# Patient Record
Sex: Male | Born: 1940 | Marital: Single | Smoking: Former smoker
Health system: Southern US, Community
[De-identification: ages and names within clinical notes are randomized; demographics above are authoritative.]

## PROBLEM LIST (undated history)

## (undated) DIAGNOSIS — I10 Essential (primary) hypertension: Secondary | ICD-10-CM

## (undated) DIAGNOSIS — I6529 Occlusion and stenosis of unspecified carotid artery: Secondary | ICD-10-CM

## (undated) DIAGNOSIS — M1990 Unspecified osteoarthritis, unspecified site: Secondary | ICD-10-CM

## (undated) DIAGNOSIS — I255 Ischemic cardiomyopathy: Secondary | ICD-10-CM

## (undated) DIAGNOSIS — E785 Hyperlipidemia, unspecified: Secondary | ICD-10-CM

## (undated) DIAGNOSIS — I509 Heart failure, unspecified: Secondary | ICD-10-CM

## (undated) DIAGNOSIS — K219 Gastro-esophageal reflux disease without esophagitis: Secondary | ICD-10-CM

## (undated) DIAGNOSIS — Z95810 Presence of automatic (implantable) cardiac defibrillator: Secondary | ICD-10-CM

## (undated) HISTORY — DX: Ischemic cardiomyopathy: I25.5

## (undated) HISTORY — PX: TONSILLECTOMY: SUR1361

## (undated) HISTORY — PX: CORONARY ANGIOPLASTY: SHX604

## (undated) HISTORY — DX: Gastro-esophageal reflux disease without esophagitis: K21.9

## (undated) HISTORY — DX: Heart failure, unspecified: I50.9

## (undated) HISTORY — DX: Occlusion and stenosis of unspecified carotid artery: I65.29

## (undated) HISTORY — DX: Essential (primary) hypertension: I10

## (undated) HISTORY — PX: CARDIAC CATHETERIZATION: SHX172

## (undated) HISTORY — PX: CAROTID ENDARTERECTOMY: SUR193

## (undated) HISTORY — DX: Hyperlipidemia, unspecified: E78.5

## (undated) HISTORY — PX: SP PTA PERIPHERAL: HXRAD401

## (undated) HISTORY — DX: Unspecified osteoarthritis, unspecified site: M19.90

## (undated) HISTORY — DX: Presence of automatic (implantable) cardiac defibrillator: Z95.810

## (undated) HISTORY — PX: HIP FRACTURE SURGERY: SHX118

---

## 2010-04-28 ENCOUNTER — Inpatient Hospital Stay: Payer: Self-pay | Admitting: Internal Medicine

## 2011-04-03 HISTORY — PX: CARDIAC DEFIBRILLATOR PLACEMENT: SHX171

## 2011-06-18 ENCOUNTER — Encounter: Payer: Self-pay | Admitting: Internal Medicine

## 2011-06-18 ENCOUNTER — Ambulatory Visit (INDEPENDENT_AMBULATORY_CARE_PROVIDER_SITE_OTHER): Payer: Medicare Other | Admitting: Internal Medicine

## 2011-06-18 ENCOUNTER — Emergency Department: Payer: Self-pay | Admitting: Emergency Medicine

## 2011-06-18 VITALS — BP 120/70 | HR 85 | Ht 70.0 in | Wt 164.0 lb

## 2011-06-18 DIAGNOSIS — I2589 Other forms of chronic ischemic heart disease: Secondary | ICD-10-CM

## 2011-06-18 DIAGNOSIS — I255 Ischemic cardiomyopathy: Secondary | ICD-10-CM | POA: Insufficient documentation

## 2011-06-18 DIAGNOSIS — Z95810 Presence of automatic (implantable) cardiac defibrillator: Secondary | ICD-10-CM

## 2011-06-18 LAB — ICD DEVICE OBSERVATION
AL THRESHOLD: 0.5 V
ATRIAL PACING ICD: 0 pct
BAMS-0001: 180 {beats}/min
BAMS-0003: 70 {beats}/min
DEVICE MODEL ICD: 641589
FVT: 0
RV LEAD AMPLITUDE: 12 mv
RV LEAD THRESHOLD: 1 V
TOT-0008: 0
TZAT-0001FASTVT: 1
TZAT-0004FASTVT: 8
TZAT-0012FASTVT: 200 ms
TZON-0005SLOWVT: 6
TZON-0010SLOWVT: 40 ms
TZST-0001FASTVT: 3
TZST-0001FASTVT: 5
TZST-0003FASTVT: 36 J
TZST-0003FASTVT: 40 J
VENTRICULAR PACING ICD: 21 pct

## 2011-06-18 NOTE — Assessment & Plan Note (Signed)
[employer]ry and pacing nurses worked for hours trying to get his device reset. It is been reset. We have given the patient a copy of the program.

## 2011-06-18 NOTE — Assessment & Plan Note (Signed)
Stable on current meds 

## 2011-06-18 NOTE — Progress Notes (Signed)
   History and Physical  Patient ID: Roger Mcneil MRN: [id number], SOB: Jul 24, 1940 71 y.o. Date of Encounter: 06/18/2011, 5:28 PM  Primary Physician: No primary provider on file. Primary Cardiologist:  Primary Electrophysiologist:    Chief Complaint: icd beeping  History of Present Illness: Roger Mcneil is a 71 y.o. male referred from the emergency room because his [employer] defibrillator implanted 3 months ago for primary prevention in a state of ischemic heart disease with an ejection fraction of 25% was beating and was not communicating with his remote station. [employer] has been by. There appeared a bit a software reset the device has been reprogrammed. There've been no intercurrent episodes. He said any chest pain or shortness of breath.   Past Medical History  Diagnosis Date  . Hypertension   . Hyperlipidemia   . Coronary artery disease   . MI (myocardial infarction) 2011  . Carotid artery occlusion   . CHF (congestive heart failure)   . Arthritis   . GERD (gastroesophageal reflux disease)   . Gout   . Ischemic cardiomyopathy     EF 25% s/p dual chamber implantable cardioverter/defibrillator implant.     Past Surgical History  Procedure Date  . Cardiac defibrillator placement 04/2011    Implanted by Dr. Juanita Craver in [place]   . Cardiac catheterization     stents x 6  . Sp pta peripheral   . Carotid endarterectomy 1900's  . Coronary angioplasty   . Tonsillectomy   . Hip fracture surgery     bilateral      Current Outpatient Prescriptions  Medication Sig Dispense Refill  . allopurinol (ZYLOPRIM) 300 MG tablet Take 300 mg by mouth daily.      Marland Kitchen aspirin 325 MG tablet Take 325 mg by mouth daily.      . carvedilol (COREG) 6.25 MG tablet Take 6.25 mg by mouth 2 (two) times daily with a meal.      . furosemide (LASIX) 20 MG tablet Take 20 mg by mouth 2 (two) times daily.      Marland Kitchen lisinopril (PRINIVIL,ZESTRIL) 10 MG tablet Take 10 mg by mouth daily.      Marland Kitchen loratadine  (CLARITIN) 10 MG tablet Take 10 mg by mouth daily.      . pantoprazole (PROTONIX) 40 MG tablet Take 40 mg by mouth daily.      . simvastatin (ZOCOR) 40 MG tablet Take 40 mg by mouth every evening.         Allergies: No Known Allergies   History  Substance Use Topics  . Smoking status: Former Smoker -- 1.0 packs/day for 45 years    Types: Cigarettes    Quit date: 06/17/2000  . Smokeless tobacco: Not on file  . Alcohol Use: 0.5 oz/week    1 drink(s) per week      History reviewed. No pertinent family history.    ROS:  Please see the history of present illness.   All other systems reviewed and negative.   Vital Signs: Blood pressure 120/70, pulse 85, height 5\' 10"  (1.778 m), weight 164 lb (74.39 kg).  PHYSICAL EXAM: Well developed and nourished in no acute distress HENT normal Neck supple with JVP-flat Clear Regular rate and rhythm, no murmurs or gallops Abd-soft with active BS No Clubbing cyanosis edema Skin-warm and dry A & Oriented  Grossly normal sensory and motor function   Labs:      ASSESSMENT AND PLAN:

## 2011-08-06 ENCOUNTER — Ambulatory Visit: Payer: Self-pay | Admitting: Gastroenterology

## 2012-03-02 IMAGING — CR DG CHEST 1V PORT
1 series · 1 of 1 positions shown · non-contrast
Comparison: none

REASON FOR EXAM: Chest Pain
COMMENTS:

[view not recorded]
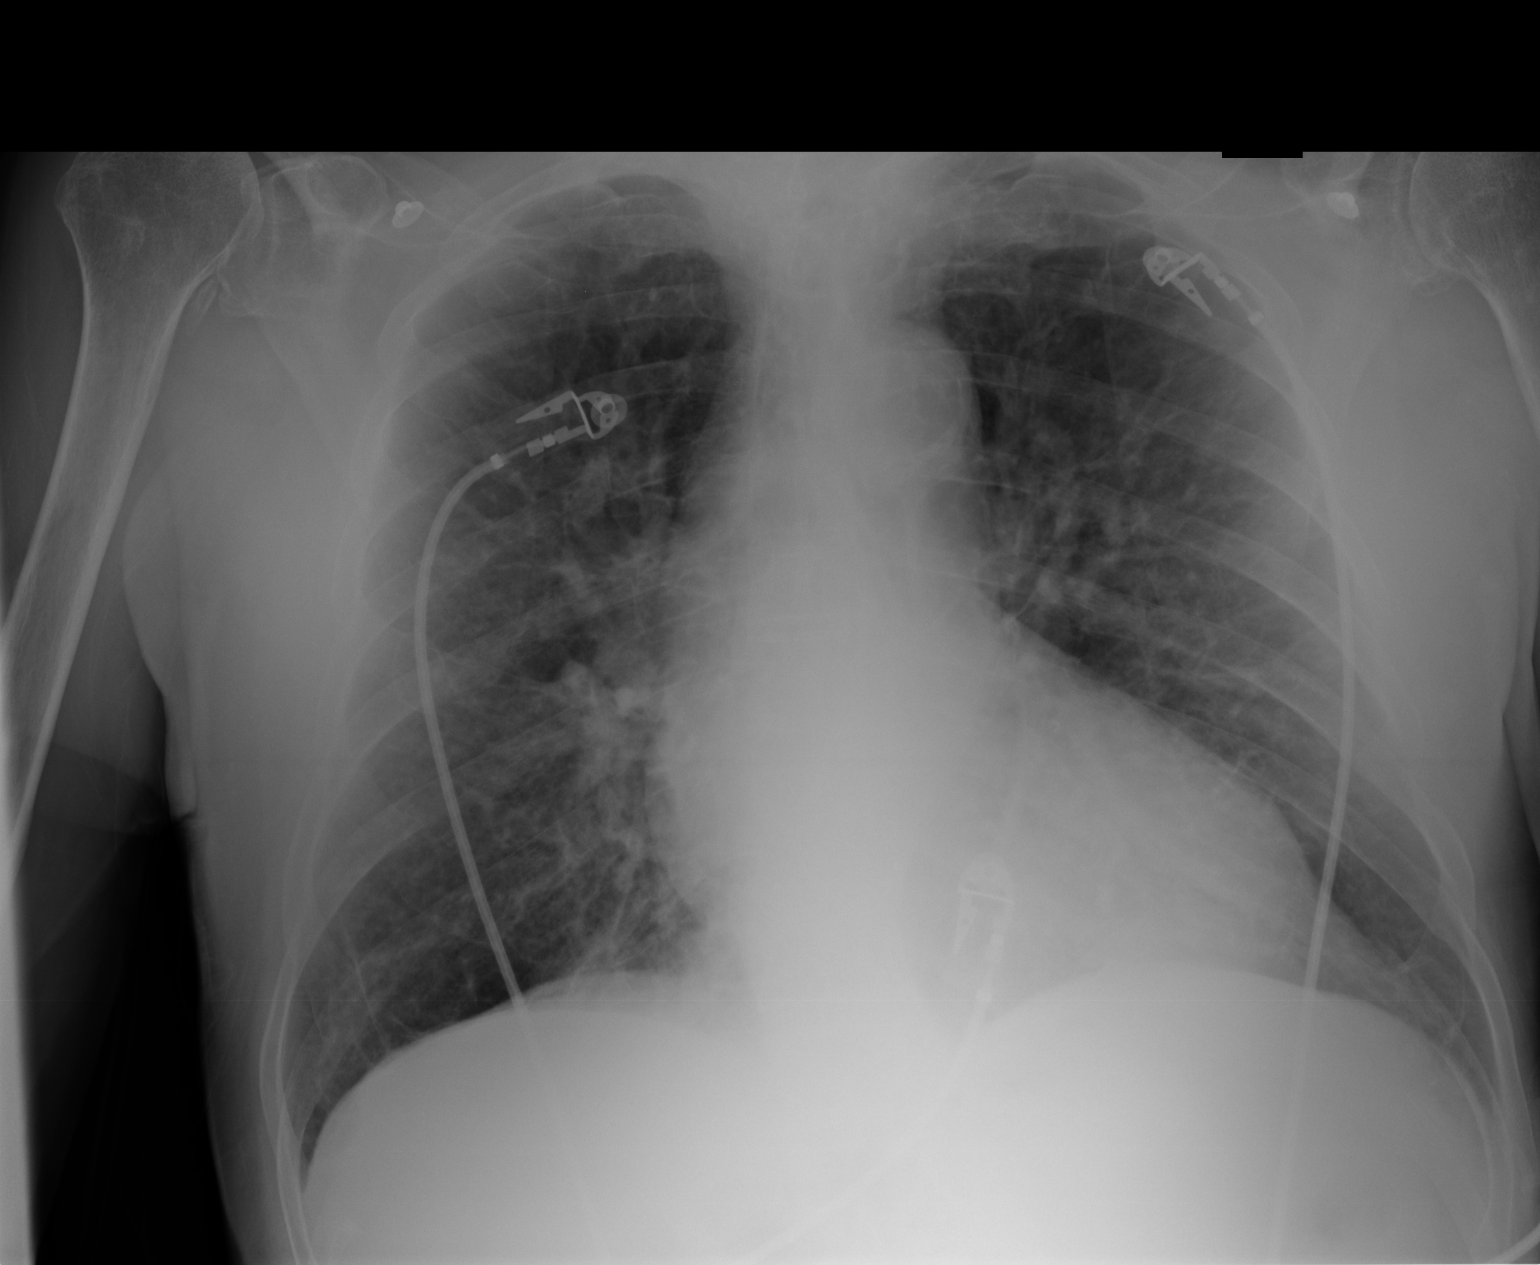

[1 of 1 positions shown; findings below may reference images not displayed]

PROCEDURE:     DXR - DXR PORTABLE CHEST SINGLE VIEW  - April 28, 2010  [DATE]

RESULT:     There is prominence of the pulmonary vascularity compatible with
pulmonary vascular congestion. No frank pulmonary edema or pleural effusion
is seen. The heart is mildly enlarged. No pneumonia is seen. Monitoring
electrodes are present.
IMPRESSION: 1.  There is mild prominence of the pulmonary vascularity compatible with
pulmonary vascular congestion.
2.  No pulmonary edema or pleural effusion is seen.
3.  Mild cardiomegaly.
4.  No pneumonia is identified.

## 2012-03-03 IMAGING — CR DG CHEST 2V
1 series · 2 of 2 positions shown · non-contrast
Comparison: none

REASON FOR EXAM: shortness of breath
COMMENTS:

[Series 1: view not recorded · 0.17mm/px · 2 of 2 slices shown]
[im 1/2]
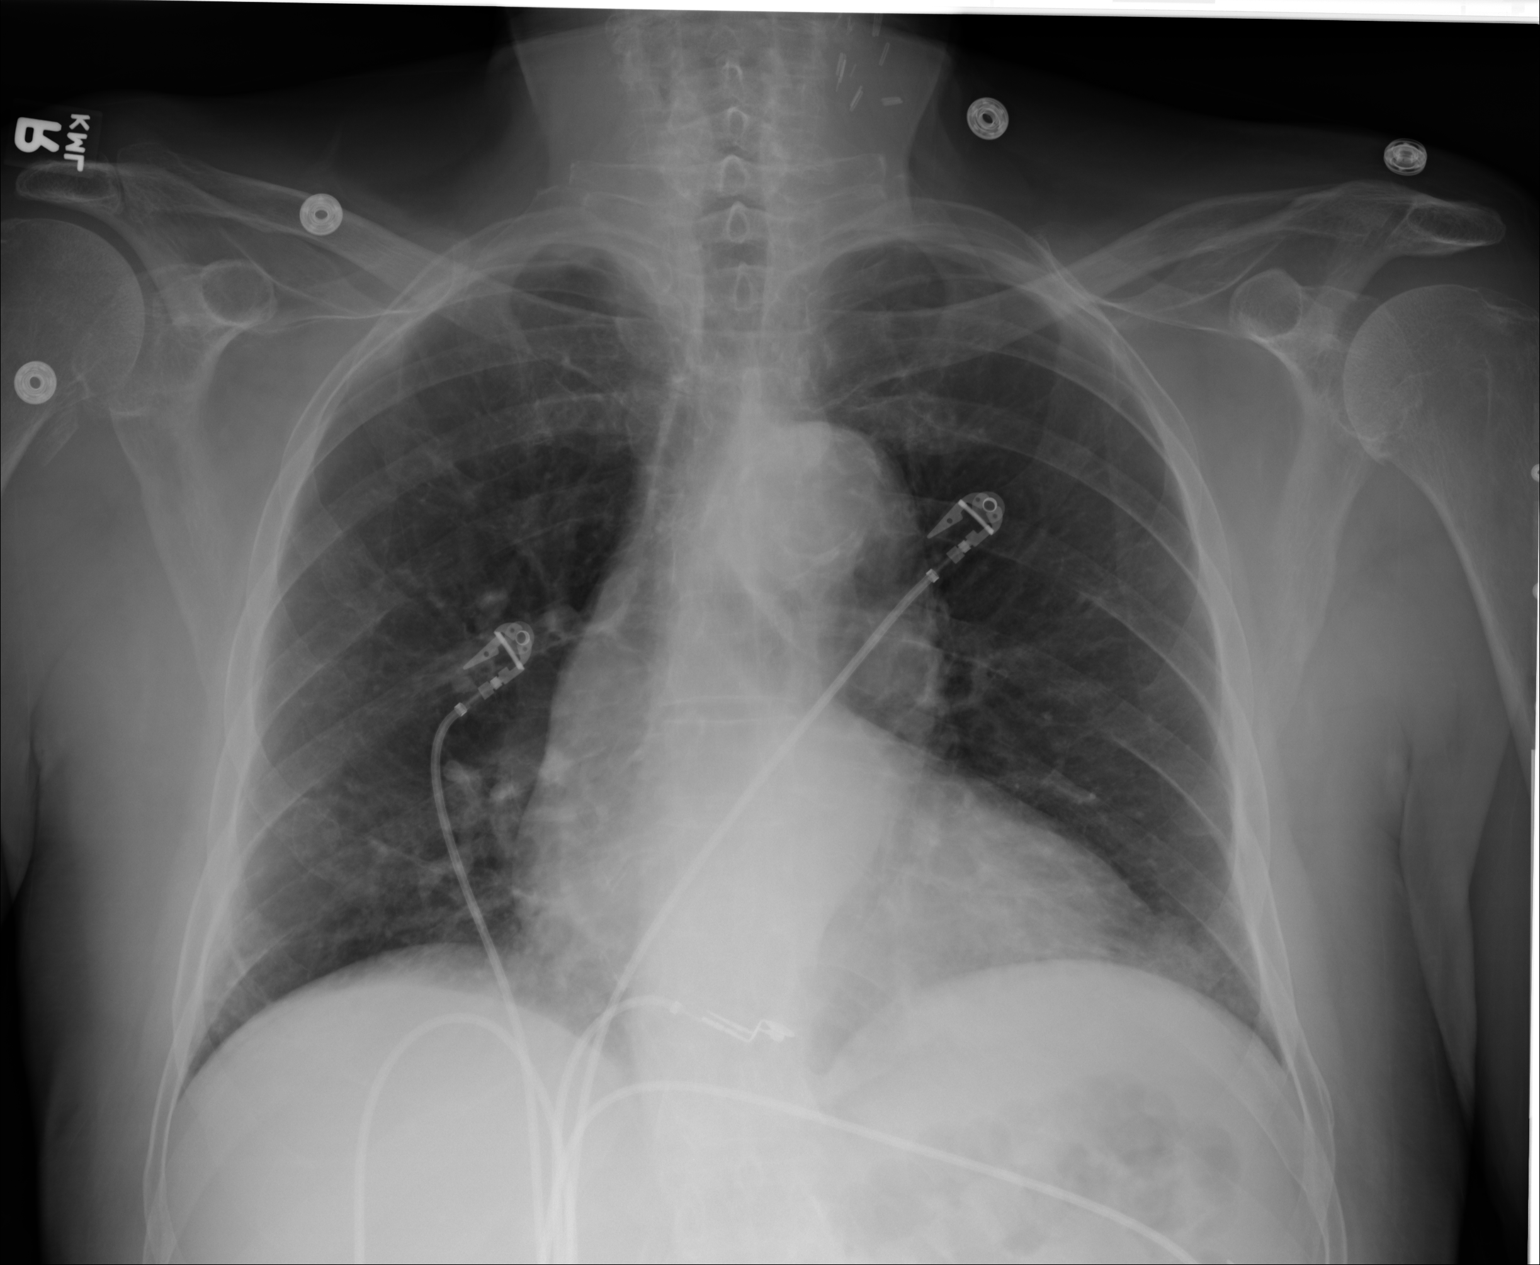
[im 2/2]
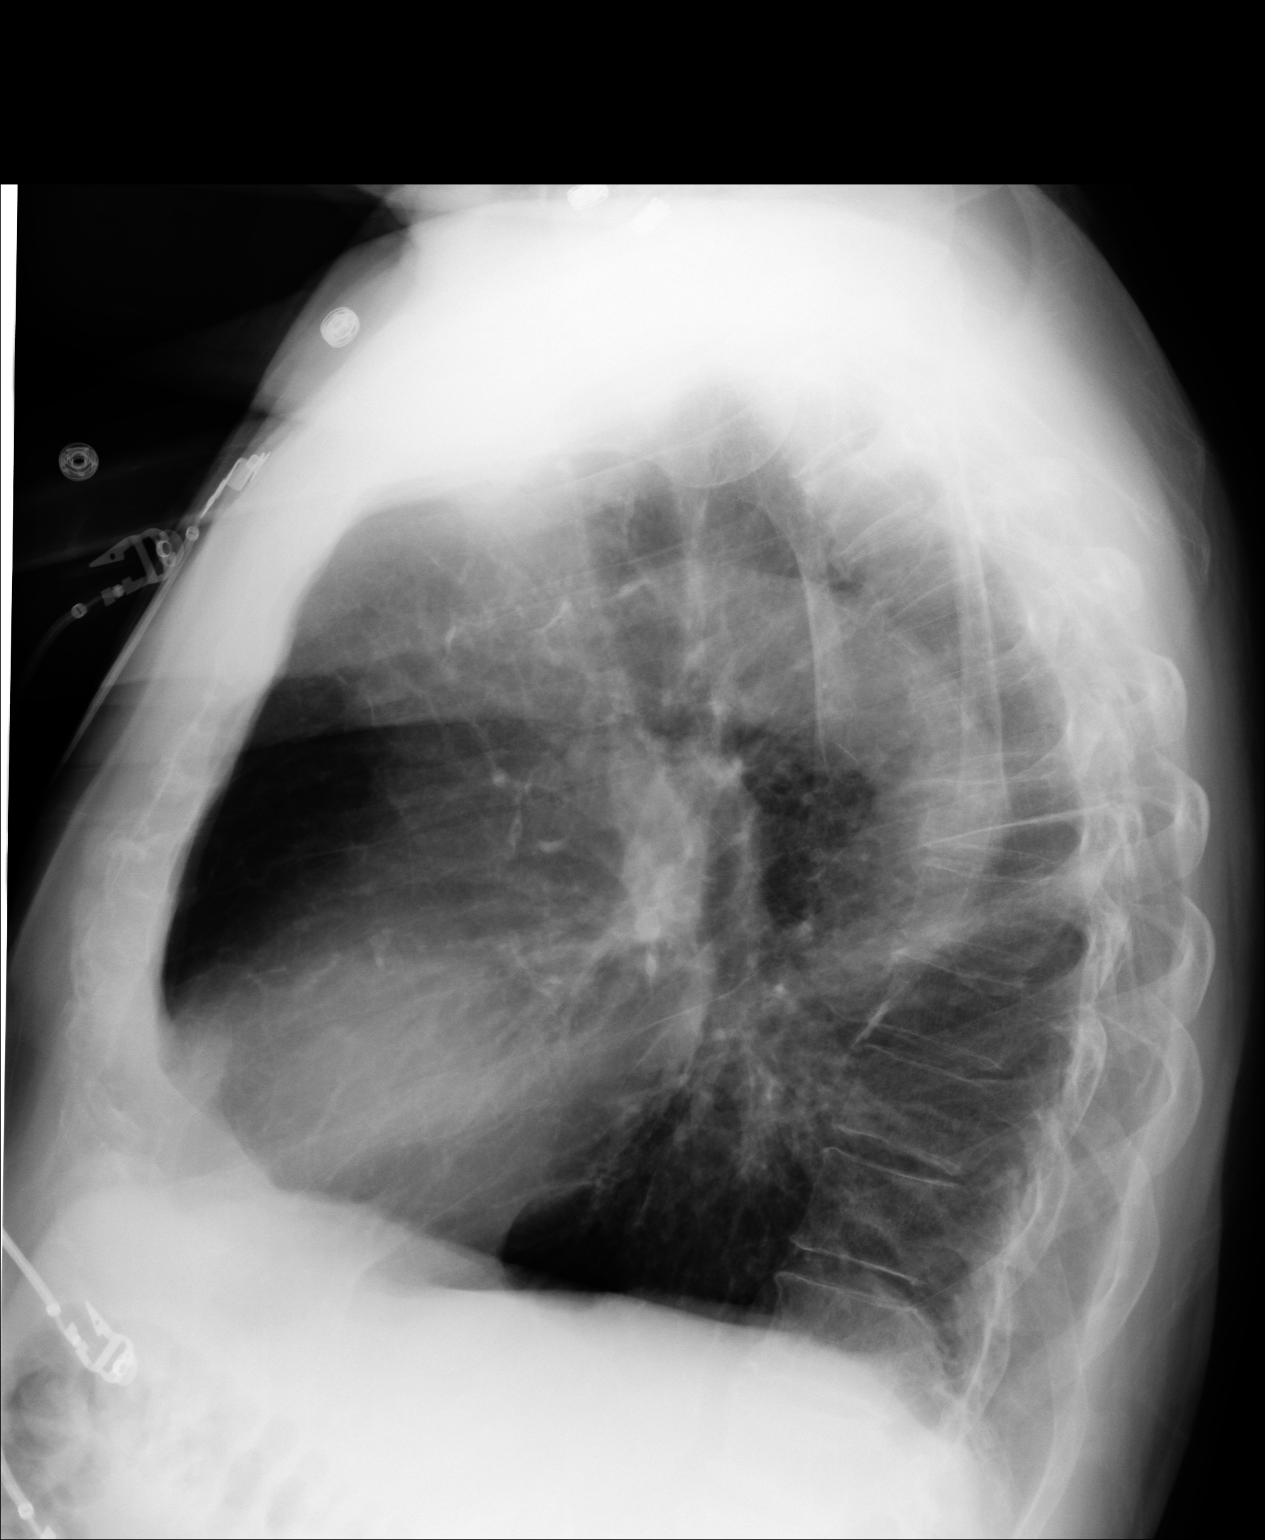

[2 of 2 positions shown; findings below may reference images not displayed]

PROCEDURE:     DXR - DXR CHEST PA (OR AP) AND LATERAL  - April 29, 2010  [DATE]

RESULT:     Comparison is made to the prior exam of 04/28/2010. The
previously observed changes of pulmonary vascular congestion show complete
or near complete clearing. No new pulmonary infiltrates are seen. The heart
is mildly enlarged but stable in size as compared to the prior exam. The
chest appears hyperexpanded compatible with a history of COPD or asthma.
IMPRESSION: 1. There has been complete or near complete clearing of the previously noted
changes of pulmonary vascular congestion.
2. No new pulmonary infiltrative changes are seen.
3. COPD.

## 2013-06-08 ENCOUNTER — Telehealth: Payer: Self-pay | Admitting: Internal Medicine

## 2013-06-08 NOTE — Telephone Encounter (Signed)
06-08-13 LMM @ 235PM FOR PT TO CALL AND LET US KNOW IF HE MOVED TO [place] OR STILL LIVES IN [place], IF HE LIVES IN [place] HE NEEDS A PHYS DEFIB CK WITH [facility]/MT

## 2014-03-16 ENCOUNTER — Encounter: Payer: Self-pay | Admitting: *Deleted

## 2021-04-30 DEATH — deceased
